# Patient Record
Sex: Female | Born: 1960 | Hispanic: No | Smoking: Current every day smoker
Health system: Southern US, Community
[De-identification: ages and names within clinical notes are randomized; demographics above are authoritative.]

## PROBLEM LIST (undated history)

## (undated) DIAGNOSIS — F32A Depression, unspecified: Secondary | ICD-10-CM

## (undated) DIAGNOSIS — F329 Major depressive disorder, single episode, unspecified: Secondary | ICD-10-CM

---

## 2000-04-08 ENCOUNTER — Encounter (INDEPENDENT_AMBULATORY_CARE_PROVIDER_SITE_OTHER): Payer: Self-pay | Admitting: Specialist

## 2000-04-08 ENCOUNTER — Ambulatory Visit (HOSPITAL_COMMUNITY): Admission: RE | Admit: 2000-04-08 | Discharge: 2000-04-08 | Payer: Self-pay | Admitting: Gynecology

## 2001-09-11 ENCOUNTER — Emergency Department (HOSPITAL_COMMUNITY): Admission: EM | Admit: 2001-09-11 | Discharge: 2001-09-11 | Payer: Self-pay | Admitting: Emergency Medicine

## 2001-09-11 ENCOUNTER — Encounter: Payer: Self-pay | Admitting: Emergency Medicine

## 2002-05-02 ENCOUNTER — Emergency Department (HOSPITAL_COMMUNITY): Admission: EM | Admit: 2002-05-02 | Discharge: 2002-05-03 | Payer: Self-pay | Admitting: Emergency Medicine

## 2006-12-13 ENCOUNTER — Emergency Department (HOSPITAL_COMMUNITY): Admission: EM | Admit: 2006-12-13 | Discharge: 2006-12-13 | Payer: Self-pay | Admitting: Emergency Medicine

## 2007-05-22 ENCOUNTER — Emergency Department (HOSPITAL_COMMUNITY): Admission: EM | Admit: 2007-05-22 | Discharge: 2007-05-22 | Payer: Self-pay | Admitting: Podiatry

## 2008-06-09 ENCOUNTER — Emergency Department (HOSPITAL_COMMUNITY): Admission: EM | Admit: 2008-06-09 | Discharge: 2008-06-10 | Payer: Self-pay | Admitting: Emergency Medicine

## 2009-03-20 ENCOUNTER — Emergency Department (HOSPITAL_COMMUNITY): Admission: EM | Admit: 2009-03-20 | Discharge: 2009-03-20 | Payer: Self-pay | Admitting: Emergency Medicine

## 2010-03-07 ENCOUNTER — Emergency Department (HOSPITAL_COMMUNITY): Admission: EM | Admit: 2010-03-07 | Discharge: 2010-03-07 | Payer: Self-pay | Admitting: Emergency Medicine

## 2011-02-12 NOTE — Op Note (Signed)
[facility]  Patient:    MALICIA, BLASDEL                          MRN: [id number] Proc. Date: 04/08/00 Adm. Date:  [id number] Attending:  Katrina Stack                           Operative Report  PREOPERATIVE DIAGNOSIS:  Nonviable uterine pregnancy [redacted] weeks gestation with large uterine ______.  POSTOPERATIVE DIAGNOSIS:  Nonviable uterine pregnancy [redacted] weeks gestation with large uterine ______.  PROCEDURE:  Suction and sharp curettage.  SURGEON:  Beather Arbour, M.D.  ANESTHESIA:  Xylocaine 1%, 20 cc paracervical block.  DESCRIPTION OF PROCEDURE:  Under excellent anesthesia as above with the patient prepped and draped in lithotomy position, the cervix was grasped with a single tooth tenaculum and pulled down into view. It was then progressively dilated to accommodate a 7 mm suction curette. The endometrial cavity was then evacuated by suction curettage. A large amount of dark brown material was delivered along with visible products of conception. At this point, after no further tissue or material was delivered the cavity was examined by sharp curette and polyp forceps, again no further tissue was produced. At this point, the procedure was terminated without complications. The patient returned to the recovery room in excellent condition. DD:  04/08/00 TD:  04/08/00 Job: [id number]

## 2011-06-30 LAB — URINALYSIS, ROUTINE W REFLEX MICROSCOPIC
Bilirubin Urine: NEGATIVE
Glucose, UA: NEGATIVE
Hgb urine dipstick: NEGATIVE
Ketones, ur: NEGATIVE
Protein, ur: NEGATIVE
Urobilinogen, UA: 1

## 2011-06-30 LAB — DIFFERENTIAL
Basophils Absolute: 0.1
Eosinophils Relative: 2
Lymphocytes Relative: 41
Neutrophils Relative %: 48

## 2011-06-30 LAB — CBC
HCT: 36.1
Platelets: 442 — ABNORMAL HIGH
RDW: 20.3 — ABNORMAL HIGH

## 2011-06-30 LAB — POCT I-STAT, CHEM 8
BUN: 19
Hemoglobin: 13.3
Potassium: 3.7
Sodium: 142
TCO2: 24

## 2011-06-30 LAB — RAPID URINE DRUG SCREEN, HOSP PERFORMED
Amphetamines: NOT DETECTED
Benzodiazepines: NOT DETECTED
Cocaine: POSITIVE — AB
Tetrahydrocannabinol: NOT DETECTED

## 2011-06-30 LAB — ETHANOL: Alcohol, Ethyl (B): <5

## 2011-06-30 LAB — POCT PREGNANCY, URINE: Preg Test, Ur: NEGATIVE

## 2011-07-09 LAB — I-STAT 8, (EC8 V) (CONVERTED LAB)
BUN: 16
Glucose, Bld: 99
Hemoglobin: 11.2 — ABNORMAL LOW
Potassium: 3.7
Sodium: 141

## 2011-07-09 LAB — ETHANOL: Alcohol, Ethyl (B): <5

## 2011-07-09 LAB — RAPID URINE DRUG SCREEN, HOSP PERFORMED
Barbiturates: NOT DETECTED
Benzodiazepines: NOT DETECTED

## 2014-04-27 ENCOUNTER — Encounter (HOSPITAL_COMMUNITY): Payer: Self-pay | Admitting: Emergency Medicine

## 2014-04-27 ENCOUNTER — Emergency Department (HOSPITAL_COMMUNITY)
Admission: EM | Admit: 2014-04-27 | Discharge: 2014-04-27 | Disposition: A | Discharge status: Home / Self Care | Payer: Self-pay | Attending: Emergency Medicine | Admitting: Emergency Medicine

## 2014-04-27 DIAGNOSIS — F17200 Nicotine dependence, unspecified, uncomplicated: Secondary | ICD-10-CM | POA: Insufficient documentation

## 2014-04-27 DIAGNOSIS — R109 Unspecified abdominal pain: Secondary | ICD-10-CM | POA: Insufficient documentation

## 2014-04-27 DIAGNOSIS — F3289 Other specified depressive episodes: Secondary | ICD-10-CM | POA: Insufficient documentation

## 2014-04-27 DIAGNOSIS — Z792 Long term (current) use of antibiotics: Secondary | ICD-10-CM | POA: Insufficient documentation

## 2014-04-27 DIAGNOSIS — N390 Urinary tract infection, site not specified: Secondary | ICD-10-CM | POA: Insufficient documentation

## 2014-04-27 DIAGNOSIS — F329 Major depressive disorder, single episode, unspecified: Secondary | ICD-10-CM | POA: Insufficient documentation

## 2014-04-27 DIAGNOSIS — Z79899 Other long term (current) drug therapy: Secondary | ICD-10-CM | POA: Insufficient documentation

## 2014-04-27 DIAGNOSIS — Z791 Long term (current) use of non-steroidal anti-inflammatories (NSAID): Secondary | ICD-10-CM | POA: Insufficient documentation

## 2014-04-27 HISTORY — DX: Major depressive disorder, single episode, unspecified: F32.9

## 2014-04-27 HISTORY — DX: Depression, unspecified: F32.A

## 2014-04-27 LAB — URINALYSIS, ROUTINE W REFLEX MICROSCOPIC
Glucose, UA: NEGATIVE mg/dL
Hgb urine dipstick: NEGATIVE
Ketones, ur: 15 mg/dL — AB
Nitrite: POSITIVE — AB
Protein, ur: 30 mg/dL — AB
Specific Gravity, Urine: 1.04 — ABNORMAL HIGH (ref 1.005–1.030)
Urobilinogen, UA: 2 mg/dL — ABNORMAL HIGH (ref 0.0–1.0)
pH: 5 (ref 5.0–8.0)

## 2014-04-27 LAB — URINE MICROSCOPIC-ADD ON

## 2014-04-27 MED ORDER — ONDANSETRON 4 MG PO TBDP
4.0000 mg | ORAL_TABLET | Freq: Once | ORAL | Status: AC
  Administered 2014-04-27: 4 mg via ORAL
  Filled 2014-04-27: qty 1

## 2014-04-27 MED ORDER — IBUPROFEN 200 MG PO TABS
600.0000 mg | ORAL_TABLET | Freq: Once | ORAL | Status: AC
  Administered 2014-04-27: 600 mg via ORAL
  Filled 2014-04-27: qty 3

## 2014-04-27 MED ORDER — HYDROMORPHONE HCL PF 1 MG/ML IJ SOLN
1.0000 mg | Freq: Once | INTRAMUSCULAR | Status: AC
  Administered 2014-04-27: 1 mg via INTRAMUSCULAR
  Filled 2014-04-27: qty 1

## 2014-04-27 MED ORDER — CEPHALEXIN 500 MG PO CAPS
500.0000 mg | ORAL_CAPSULE | Freq: Once | ORAL | Status: AC
  Administered 2014-04-27: 500 mg via ORAL
  Filled 2014-04-27: qty 1

## 2014-04-27 MED ORDER — ONDANSETRON HCL 4 MG PO TABS: 4.0000 mg | ORAL_TABLET | Freq: Four times a day (QID) | ORAL | Status: DC

## 2014-04-27 MED ORDER — OXYCODONE-ACETAMINOPHEN 5-325 MG PO TABS: 1.0000 | ORAL_TABLET | ORAL | Status: DC | PRN

## 2014-04-27 MED ORDER — CEPHALEXIN 500 MG PO CAPS: 500.0000 mg | ORAL_CAPSULE | Freq: Four times a day (QID) | ORAL | Status: DC

## 2014-04-27 NOTE — ED Notes (Signed)
Pt states she took AZO pills and her urine may have an altered color

## 2014-04-27 NOTE — ED Notes (Signed)
Pt complains of abd pain since 8pm, she has vomited a few times and states that the pain radiates to her back, she complains of urgency and burning urination

## 2014-04-27 NOTE — Discharge Instructions (Signed)
Urinary Tract Infection °Urinary tract infections (UTIs) can develop anywhere along your urinary tract. Your urinary tract is your body's drainage system for removing wastes and extra water. Your urinary tract includes two kidneys, two ureters, a bladder, and a urethra. Your kidneys are a pair of bean-shaped organs. Each kidney is about the size of your fist. They are located below your ribs, one on each side of your spine. °CAUSES °Infections are caused by microbes, which are microscopic organisms, including fungi, viruses, and bacteria. These organisms are so small that they can only be seen through a microscope. Bacteria are the microbes that most commonly cause UTIs. °SYMPTOMS  °Symptoms of UTIs may vary by age and gender of the patient and by the location of the infection. Symptoms in young women typically include a frequent and intense urge to urinate and a painful, burning feeling in the bladder or urethra during urination. Older women and men are more likely to be tired, shaky, and weak and have muscle aches and abdominal pain. A fever may mean the infection is in your kidneys. Other symptoms of a kidney infection include pain in your back or sides below the ribs, nausea, and vomiting. °DIAGNOSIS °To diagnose a UTI, your caregiver will ask you about your symptoms. Your caregiver also will ask to provide a urine sample. The urine sample will be tested for bacteria and white blood cells. White blood cells are made by your body to help fight infection. °TREATMENT  °Typically, UTIs can be treated with medication. Because most UTIs are caused by a bacterial infection, they usually can be treated with the use of antibiotics. The choice of antibiotic and length of treatment depend on your symptoms and the type of bacteria causing your infection. °HOME CARE INSTRUCTIONS °· If you were prescribed antibiotics, take them exactly as your caregiver instructs you. Finish the medication even if you feel better after you  have only taken some of the medication. °· Drink enough water and fluids to keep your urine clear or pale yellow. °· Avoid caffeine, tea, and carbonated beverages. They tend to irritate your bladder. °· Empty your bladder often. Avoid holding urine for long periods of time. °· Empty your bladder before and after sexual intercourse. °· After a bowel movement, women should cleanse from front to back. Use each tissue only once. °SEEK MEDICAL CARE IF:  °· You have back pain. °· You develop a fever. °· Your symptoms do not begin to resolve within 3 days. °SEEK IMMEDIATE MEDICAL CARE IF:  °· You have severe back pain or lower abdominal pain. °· You develop chills. °· You have nausea or vomiting. °· You have continued burning or discomfort with urination. °MAKE SURE YOU:  °· Understand these instructions. °· Will watch your condition. °· Will get help right away if you are not doing well or get worse. °Document Released: 06/23/2005 Document Revised: 03/14/2012 Document Reviewed: 10/22/2011 °ExitCare® Patient Information ©2015 [employer]. This information is not intended to replace advice given to you by your health care provider. Make sure you discuss any questions you have with your health care provider. ° ° °Emergency Department Resource Guide °1) Find a Doctor and Pay Out of Pocket °Although you won't have to find out who is covered by your insurance plan, it is a good idea to ask around and get recommendations. You will then need to call the office and see if the doctor you have chosen will accept you as a new patient and what types of   options they offer for patients who are self-pay. Some doctors offer discounts or will set up payment plans for their patients who do not have insurance, but you will need to ask so you aren't surprised when you get to your appointment. ° °2) Contact Your Local Health Department °Not all health departments have doctors that can see patients for sick visits, but many do, so it is worth  a call to see if yours does. If you don't know where your local health department is, you can check in your phone book. The CDC also has a tool to help you locate your state's health department, and many state websites also have listings of all of their local health departments. ° °3) Find a Walk-in Clinic °If your illness is not likely to be very severe or complicated, you may want to try a walk in clinic. These are popping up all over the country in pharmacies, drugstores, and shopping centers. They're usually staffed by nurse practitioners or physician assistants that have been trained to treat common illnesses and complaints. They're usually fairly quick and inexpensive. However, if you have serious medical issues or chronic medical problems, these are probably not your best option. ° °No Primary Care Doctor: °- Call [facility] at  832-8000 - they can help you locate a primary care doctor that  accepts your insurance, provides certain services, etc. °- Physician Referral Service- 1-800-533-3463 ° °Chronic Pain Problems: °Organization         Address  Phone   Notes  °[facility]  (336) 297-2271 Patients need to be referred by their primary care doctor.  ° °Medication Assistance: °Organization         Address  Phone   Notes  °[place] [facility] [address] °[place], [place] [address] (336) 641-8030 --Must be a resident of [place] °-- Must have NO insurance coverage whatsoever (no Medicaid/ Medicare, etc.) °-- The pt. MUST have a primary care doctor that directs their care regularly and follows them in the community °  °[facility]  (866) 331-1348   °[employer]  (888) 892-1162   ° °Agencies that provide inexpensive medical care: °Organization         Address  Phone   Notes  °[facility]  (336) 832-8035   °[facility]    (336) 832-7272   °[facility] [address] °[place], [place]  [address] (336) 832-4777   °[facility] [address], °[place] (336) 271-4999   °[facility]    (336) 373-0678   °[facility]    (336) 272-1050   °[facility] ° [address], [place] Phone:  (336) 832-4444, Fax:  (336) 832-4440 Hours of Operation:  9 am - 6 pm, M-F.  Also accepts Medicaid/Medicare and self-pay.  °[facility] ° [address], [place] Phone: (336) 832-3150, Fax: (336) 832-3151. Hours of Operation:  8:30 am - 5:30 pm, M-F.  Also accepts Medicaid and self-pay.  °[facility] [place] [address], [place] Phone: (336) 878-6027   °[facility] [address], [place], [place] (336)723-1848, Ext. 123 Mondays & Thursdays: 7-9 AM.  First 15 patients are seen on a first come, first serve basis. °  ° °Medicaid-accepting [place] Providers: ° °Organization         Address  Phone   Notes  °  [facility] [address], [place] (336) 641-2100 Also accepts self-pay patients.  °[facility] [address], [place] ° (336) 856-9996   °[facility] [address], [place] (336) 288-8857   °[facility] [address] [place] [address], [place] (336) 299-7000   °Veita Bland [address], [place]  ° (336) 373-1557 Only accepts [place] Medicaid patients after they have their name applied to their card.  ° °Self-Pay (no insurance) in [place]: ° °Organization         Address  Phone   Notes  °Sickle Cell Patients, [facility] [address], [place] (336) 832-1970   °[facility] [address], [place] (336) 832-4400   °[facility] [place] ° [address], [place] (336) 992-4800   °[facility]/Dr. Osei-Bonsu ° [address] [place] [address], [place] or [address], [place] (336) 841-8500 Phone number for both [place] and [place] locations is the same.  °[facility] [address], [place] (336) 299-0000   °[facility] [address] [place] [address], [place] or [address] (336) 852-7530 °(336) 878-2260   °[facility] [address], [place] (336) 350-1642, phone; (336) 294-5005, fax Sees patients 1st and 3rd Saturday of every month.  Must not qualify for public or private insurance (i.e. Medicaid, Medicare, [place] Health Choice, Veterans' Benefits) • Household income should be no more than 200% of the poverty level •The clinic cannot treat you if you are pregnant or think you are pregnant • Sexually transmitted diseases are not treated at the clinic.  ° ° °Dental Care: °Organization         Address  Phone  Notes  °[facility] [facility] [address], [place] (336) 641-6152 Accepts children up to age 21 who are enrolled in Medicaid or [place] Health Choice; pregnant women with a Medicaid card; and children who have applied for Medicaid or [place] Health Choice, but were declined, whose parents can pay a reduced fee at time of service.  °[facility] [place]  [address], [place] (336) 641-7733 Accepts children up to age 21 who are enrolled in Medicaid or [place] Health Choice; pregnant women with a Medicaid card; and children who have applied for Medicaid or [place] Health Choice, but were declined, whose parents can pay a reduced fee at time of service.  °[facility] ° [address], [place] (336) 641-4533 Patients are seen by appointment only. Walk-ins are not accepted. [facility] will see patients 18 years of age and older. °Monday - Tuesday (8am-5pm) °Most Wednesdays (8:30-5pm) °$30 per visit, cash only  °[facility] ° [address], [place] (336) 641-4533  Patients are seen by appointment only. Walk-ins are not accepted. [facility] will see patients 18 years of age and older. °One Wednesday Evening (Monthly: Volunteer Based).  $30 per visit, cash only  °[facility]  (919) 537-3737 for adults; Children under age 4, call Graduate Pediatric Dentistry at (919) [phone]. Children aged 4-14, please call (919) [phone] to request a pediatric application. ° Dental services are provided in all areas of dental care including fillings, crowns and bridges, complete and partial   dentures, implants, gum treatment, root canals, and extractions. Preventive care is also provided. Treatment is provided to both adults and children. °Patients are selected via a lottery and there is often a waiting list. °  °[facility] [address], °[place] ° (336) 763-8833 [email or web address] °  °[facility] [address], [place], [place] (336)723-1848, Ext. 123 Second and Fourth Thursday of each month, opens at 6:30 AM; Clinic ends at 9 AM.  Patients are seen on a first-come first-served basis, and a limited number are seen during each clinic.  ° °[facility] ° [address], [place], [place] (336) 723-7904   Eligibility Requirements °You must have lived in [place], [place], or [place] counties for at least the last three months. °  You cannot be eligible for state or federal sponsored healthcare insurance, including Veterans Administration, Medicaid, or Medicare. °  You generally cannot be eligible for healthcare insurance through your employer.  °  How to apply: °Eligibility screenings are held every Tuesday and Wednesday afternoon from 1:00 pm until 4:00 pm. You do not need an appointment for the interview!  °[facility] [address], [place], [place] 336-631-2330   °[facility]  336-342-8273   °[facility]  336-703-3100   ° [facility]   336-570-6415   ° °Behavioral Health Resources in the Community: °Intensive Outpatient Programs °Organization         Address  Phone  Notes  °[facility] [address], [place], [place] 336-878-6098   °[facility] [address], [place], [place] 336-832-9800   °[facility] [address], [place], [place] ° 336-882-2125   °[facility] [address],  °[place], [place] 1-800-853-5163 or 336-641-4981   °Substance Abuse Resources °Organization         Address  Phone  Notes  °[facility]  336-882-2125   °[facility]  336-784-9470   °[facility]  336-285-9073   °[facility]  336-845-3988   °Residential & Outpatient Substance Abuse Program  1-800-659-3381   °Psychological Services °Organization         Address  Phone  Notes  °[facility]  336- 832-9600   °[facility]  336- 378-7881   °[facility] [address], [place] 1-800-853-5163 or 336-641-4981   ° °Mobile Crisis Teams °Organization         Address  Phone  Notes  °[facility], Mobile Crisis Care Unit  1-877-626-1772   °Assertive °[facility] ° [address] [place], [place] 336-834-9664   °Sharon DeEsch [address] °[place] 336-554-5454   ° °Self-Help/Support Groups °Organization         Address  Phone             Notes  °[facility] [place] - variety of support groups  336- 373-1402 Call for more information  °Narcotics Anonymous (NA), [facility] [address], °[place]  2 meetings at this location  ° °Residential Treatment Programs °Organization         Address  Phone  Notes  °[facility] [address],    °[place]   1-866-801-8205   °[facility] ° [address], [place],  704-293-8524   °[facility] [address], [place] 336-845-3988 Admissions: 8am-3pm M-F   °  [facility] [address]    °[place], [place] 336-841-1104   °[facility] [address], [place], [place] 336-379-7146   °[facility] [address]  °[place], [place] 336-285-9073   °[facility] Intensive Outpatient [address], [place], [place] 336-852-3033   °[facility] [address]  °[place], [place] 1-877-615-2722 or 336-784-9470   °[facility] [address] [place], [place] 336-227-7417 Accepts Medicaid  °[facility] [address]  °[place] 1-800-659-3381 Substance Abuse/Addiction Treatment  ° °[place] Behavioral Health Resources °Organization         Address  Phone  Notes  °[facility]  (888) 581-9988   °Julie Brannon, PhD [address] [place], [place]   (336) 349-5553 or (336) 951-0000   °[facility]   [address] °[place], [place] (336) 349-4454   °[facility] [address], [place], [place] (336) 342-8316 Insurance/Medicaid/sponsorship through [facility]  °[facility] [address]                                    [place], [place] (336) 342-8316 Therapy/tele-psych/case  °[facility] [address]  ° [place], [place] (336) 349-2233    °Dr. Arfeen  (336) 349-4544   °[facility] [place]  [employer] [place] [facility] 1) [address], [place] °2) [address], [place] °3)  [address], [place] (336) 349-3220 °(336) 342-7768 ° °(336) 342-8140   °[place] [facility] (336) 342-1394 or (336) 342-3537 (After Hours)    ° ° ° ° °

## 2014-04-27 NOTE — ED Notes (Signed)
Bed: WA13 Expected date:  Expected time:  Means of arrival:  Comments: 

## 2014-04-27 NOTE — ED Notes (Signed)
Pt given medication and will rest here for about 45 minutes

## 2014-04-28 LAB — URINE CULTURE
Colony Count: NO GROWTH
Culture: NO GROWTH

## 2014-05-05 NOTE — ED Provider Notes (Signed)
CSN: [id number]     Arrival date & time 04/27/14  0329 History   First MD Initiated Contact with Patient 04/27/14 [phone]     Chief Complaint  Patient presents with  . Abdominal Pain     (Consider location/radiation/quality/duration/timing/severity/associated sxs/prior Treatment) HPI  52yF with abdominal pain. Onset last evening around 2000. Pain in lower abdomen. Feels like bad cramps. Radiates into lower back. Associated with n/v. Dysuria and feeling of urgency. No hematuria. No unusual vaginal bleeding or discharge. No fever or chills. No diarrhea. No sick contacts.   Past Medical History  Diagnosis Date  . Depressed    History reviewed. No pertinent past surgical history. History reviewed. No pertinent family history. History  Substance Use Topics  . Smoking status: Current Every Day Smoker  . Smokeless tobacco: Not on file  . Alcohol Use: Yes   OB History   Grav Para Term Preterm Abortions TAB SAB Ect Mult Living                 Review of Systems  All systems reviewed and negative, other than as noted in HPI.   Allergies  Review of patient's allergies indicates no known allergies.  Home Medications   Prior to Admission medications   Medication Sig Start Date End Date Taking? Authorizing Provider  gabapentin (NEURONTIN) 300 MG capsule Take 300 mg by mouth 3 (three) times daily.   Yes Historical Provider, MD  traZODone (DESYREL) 100 MG tablet Take 150 mg by mouth at bedtime.    Yes Historical Provider, MD  cephALEXin (KEFLEX) 500 MG capsule Take 1 capsule (500 mg total) by mouth 4 (four) times daily. 04/27/14   Raeford RazorStephen Nayra Coury, MD  ondansetron (ZOFRAN) 4 MG tablet Take 1 tablet (4 mg total) by mouth every 6 (six) hours. 04/27/14   Raeford RazorStephen Yehoshua Vitelli, MD  oxyCODONE-acetaminophen (PERCOCET/ROXICET) 5-325 MG per tablet Take 1 tablet by mouth every 4 (four) hours as needed for severe pain. 04/27/14   Raeford RazorStephen Wendolyn Raso, MD   BP 108/71  Pulse 52  Temp(Src) 97.9 F (36.6 C) (Oral)  Resp  17  Ht 5\' 2"  (1.575 m)  Wt 130 lb (58.968 kg)  BMI 23.77 kg/m2  SpO2 100% Physical Exam  Nursing note and vitals reviewed. Constitutional: She appears well-developed and well-nourished. No distress.  HENT:  Head: Normocephalic and atraumatic.  Eyes: Conjunctivae are normal. Right eye exhibits no discharge. Left eye exhibits no discharge.  Neck: Neck supple.  Cardiovascular: Normal rate, regular rhythm and normal heart sounds.  Exam reveals no gallop and no friction rub.   No murmur heard. Pulmonary/Chest: Effort normal and breath sounds normal. No respiratory distress.  Abdominal: Soft. She exhibits no distension. There is tenderness.  Mild suprapubic tenderness. No rebound/guarding.   Genitourinary:  No cva tenderness  Musculoskeletal: She exhibits no edema and no tenderness.  Neurological: She is alert.  Skin: Skin is warm and dry.  Psychiatric: She has a normal mood and affect. Her behavior is normal. Thought content normal.    ED Course  Procedures (including critical care time) Labs Review Labs Reviewed  URINALYSIS, ROUTINE W REFLEX MICROSCOPIC - Abnormal; Notable for the following:    Color, Urine RED (*)    APPearance CLOUDY (*)    Specific Gravity, Urine 1.040 (*)    Bilirubin Urine SMALL (*)    Ketones, ur 15 (*)    Protein, ur 30 (*)    Urobilinogen, UA 2.0 (*)    Nitrite POSITIVE (*)    Leukocytes,  UA SMALL (*)    All other components within normal limits  URINE MICROSCOPIC-ADD ON - Abnormal; Notable for the following:    Squamous Epithelial / LPF FEW (*)    Bacteria, UA FEW (*)    All other components within normal limits  URINE CULTURE    Imaging Review No results found.   EKG Interpretation None      MDM   Final diagnoses:  UTI (lower urinary tract infection)    52yF with dysuria. UA concerning for UTI. I feel appropriate for outpt tx. Urine culture sent.     Raeford Razor, MD 05/05/14 [phone]

## 2015-01-05 ENCOUNTER — Emergency Department (HOSPITAL_COMMUNITY)
Admission: EM | Admit: 2015-01-05 | Discharge: 2015-01-06 | Disposition: A | Discharge status: Home / Self Care | Payer: No Typology Code available for payment source | Attending: Emergency Medicine | Admitting: Emergency Medicine

## 2015-01-05 ENCOUNTER — Encounter (HOSPITAL_COMMUNITY): Payer: Self-pay | Admitting: Emergency Medicine

## 2015-01-05 ENCOUNTER — Emergency Department (HOSPITAL_COMMUNITY): Payer: No Typology Code available for payment source

## 2015-01-05 DIAGNOSIS — Z79899 Other long term (current) drug therapy: Secondary | ICD-10-CM | POA: Diagnosis not present

## 2015-01-05 DIAGNOSIS — R9431 Abnormal electrocardiogram [ECG] [EKG]: Secondary | ICD-10-CM | POA: Insufficient documentation

## 2015-01-05 DIAGNOSIS — R0602 Shortness of breath: Secondary | ICD-10-CM | POA: Diagnosis present

## 2015-01-05 DIAGNOSIS — F329 Major depressive disorder, single episode, unspecified: Secondary | ICD-10-CM | POA: Diagnosis not present

## 2015-01-05 DIAGNOSIS — Z720 Tobacco use: Secondary | ICD-10-CM | POA: Insufficient documentation

## 2015-01-05 LAB — URINALYSIS, ROUTINE W REFLEX MICROSCOPIC
BILIRUBIN URINE: NEGATIVE
GLUCOSE, UA: NEGATIVE mg/dL
HGB URINE DIPSTICK: NEGATIVE
Ketones, ur: NEGATIVE mg/dL
Leukocytes, UA: NEGATIVE
Nitrite: NEGATIVE
PROTEIN: NEGATIVE mg/dL
Specific Gravity, Urine: 1.017 (ref 1.005–1.030)
Urobilinogen, UA: 0.2 mg/dL (ref 0.0–1.0)
pH: 8 (ref 5.0–8.0)

## 2015-01-05 LAB — I-STAT TROPONIN, ED
Troponin i, poc: 0 ng/mL (ref 0.00–0.08)
Troponin i, poc: 0 ng/mL (ref 0.00–0.08)

## 2015-01-05 LAB — CBC WITH DIFFERENTIAL/PLATELET
BASOS ABS: 0 10*3/uL (ref 0.0–0.1)
Basophils Relative: 0 % (ref 0–1)
EOS PCT: 1 % (ref 0–5)
Eosinophils Absolute: 0.1 10*3/uL (ref 0.0–0.7)
HEMATOCRIT: 41.2 % (ref 36.0–46.0)
Hemoglobin: 14.2 g/dL (ref 12.0–15.0)
Lymphocytes Relative: 46 % (ref 12–46)
Lymphs Abs: 2.7 10*3/uL (ref 0.7–4.0)
MCH: 32.4 pg (ref 26.0–34.0)
MCHC: 34.5 g/dL (ref 30.0–36.0)
MCV: 94.1 fL (ref 78.0–100.0)
MONOS PCT: 7 % (ref 3–12)
Monocytes Absolute: 0.4 10*3/uL (ref 0.1–1.0)
NEUTROS ABS: 2.8 10*3/uL (ref 1.7–7.7)
NEUTROS PCT: 46 % (ref 43–77)
PLATELETS: 276 10*3/uL (ref 150–400)
RBC: 4.38 MIL/uL (ref 3.87–5.11)
RDW: 12.3 % (ref 11.5–15.5)
WBC: 5.9 10*3/uL (ref 4.0–10.5)

## 2015-01-05 LAB — I-STAT CHEM 8, ED
BUN: 12 mg/dL (ref 6–23)
CALCIUM ION: 1.24 mmol/L — AB (ref 1.12–1.23)
CHLORIDE: 103 mmol/L (ref 96–112)
Creatinine, Ser: 1 mg/dL (ref 0.50–1.10)
Glucose, Bld: 101 mg/dL — ABNORMAL HIGH (ref 70–99)
HCT: 49 % — ABNORMAL HIGH (ref 36.0–46.0)
Hemoglobin: 16.7 g/dL — ABNORMAL HIGH (ref 12.0–15.0)
POTASSIUM: 3.7 mmol/L (ref 3.5–5.1)
Sodium: 141 mmol/L (ref 135–145)
TCO2: 22 mmol/L (ref 0–100)

## 2015-01-05 LAB — COMPREHENSIVE METABOLIC PANEL
ALK PHOS: 50 U/L (ref 39–117)
ALT: 17 U/L (ref 0–35)
AST: 20 U/L (ref 0–37)
Albumin: 4.1 g/dL (ref 3.5–5.2)
Anion gap: 8 (ref 5–15)
BUN: 13 mg/dL (ref 6–23)
CO2: 26 mmol/L (ref 19–32)
CREATININE: 0.98 mg/dL (ref 0.50–1.10)
Calcium: 9.6 mg/dL (ref 8.4–10.5)
Chloride: 105 mmol/L (ref 96–112)
GFR calc Af Amer: 76 mL/min — ABNORMAL LOW (ref >90)
GFR calc non Af Amer: 65 mL/min — ABNORMAL LOW (ref >90)
GLUCOSE: 107 mg/dL — AB (ref 70–99)
Potassium: 3.7 mmol/L (ref 3.5–5.1)
Sodium: 139 mmol/L (ref 135–145)
Total Bilirubin: 0.6 mg/dL (ref 0.3–1.2)
Total Protein: 6.9 g/dL (ref 6.0–8.3)

## 2015-01-05 LAB — D-DIMER, QUANTITATIVE: D-Dimer, Quant: <0.27 ug/mL-FEU (ref 0.00–0.48)

## 2015-01-05 LAB — BRAIN NATRIURETIC PEPTIDE: B NATRIURETIC PEPTIDE 5: 23 pg/mL (ref 0.0–100.0)

## 2015-01-05 LAB — LIPASE, BLOOD: Lipase: 27 U/L (ref 11–59)

## 2015-01-05 MED ORDER — LORAZEPAM 2 MG/ML IJ SOLN: 1.0000 mg | Freq: Once | INTRAMUSCULAR | Status: DC

## 2015-01-05 MED ORDER — ASPIRIN 81 MG PO CHEW
324.0000 mg | CHEWABLE_TABLET | Freq: Once | ORAL | Status: AC
  Administered 2015-01-05: 324 mg via ORAL
  Filled 2015-01-05: qty 4

## 2015-01-05 MED ORDER — LORAZEPAM 1 MG PO TABS
1.0000 mg | ORAL_TABLET | Freq: Once | ORAL | Status: AC
  Administered 2015-01-05: 1 mg via ORAL
  Filled 2015-01-05: qty 1

## 2015-01-05 NOTE — Discharge Instructions (Signed)
It is very important that you follow with the cardiologist as soon as possible. Do not hesitate to return to emergency room for worsening or concerning symptoms.

## 2015-01-05 NOTE — ED Notes (Signed)
Pt denies chest pain but reports just short of breath. Pt resting quietly. Appears in no acute distress.

## 2015-01-05 NOTE — ED Provider Notes (Signed)
CSN: [id number]     Arrival date & time 01/05/15  1925 History   First MD Initiated Contact with Patient 01/05/15 2007     Chief Complaint  Patient presents with  . Shortness of Breath     (Consider location/radiation/quality/duration/timing/severity/associated sxs/prior Treatment) HPI   Sara Reeves is a 54 y.o. female complaining of acute onset of shortness of breath which woke her from sleep at 6 AM this morning. States that she has about a 3 pillow orthopnea with this which is new. States that she has been trying to sleep all day but she is woken up from sleep every time with the sensation of shortness of breath. She denies chest pain/Tightness, increasing peripheral edema, history of cardiac issues, family history of cardiac issues, history of DVT or PE, recent immobilization, calf pain or leg swelling, exogenous estrogen, fever, chills, cough. States that she feels nauseous and panicky on review of systems. Patient lost her job 2 weeks ago.   Smokes 3 cigarettes daily, has smoked at this level all of her life.   Past Medical History  Diagnosis Date  . Depressed    History reviewed. No pertinent past surgical history. No family history on file. History  Substance Use Topics  . Smoking status: Current Every Day Smoker  . Smokeless tobacco: Not on file  . Alcohol Use: Yes   OB History    No data available     Review of Systems  10 systems reviewed and found to be negative, except as noted in the HPI.   Allergies  Review of patient's allergies indicates no known allergies.  Home Medications   Prior to Admission medications   Medication Sig Start Date End Date Taking? Authorizing Provider  gabapentin (NEURONTIN) 300 MG capsule Take 300 mg by mouth 3 (three) times daily.   Yes Historical Provider, MD  ranitidine (ZANTAC) 150 MG tablet Take 150 mg by mouth daily as needed for heartburn (indigestion).   Yes Historical Provider, MD  traZODone (DESYREL) 100 MG tablet Take 150  mg by mouth at bedtime.    Yes Historical Provider, MD  cephALEXin (KEFLEX) 500 MG capsule Take 1 capsule (500 mg total) by mouth 4 (four) times daily. Patient not taking: Reported on 01/05/2015 04/27/14   Raeford Razor, MD  LORazepam (ATIVAN) 1 MG tablet Take 1 tablet (1 mg total) by mouth every 6 (six) hours as needed for anxiety or sleep. 01/06/15   Cambree Hendrix, PA-C  ondansetron (ZOFRAN) 4 MG tablet Take 1 tablet (4 mg total) by mouth every 6 (six) hours. Patient not taking: Reported on 01/05/2015 04/27/14   Raeford Razor, MD  oxyCODONE-acetaminophen (PERCOCET/ROXICET) 5-325 MG per tablet Take 1 tablet by mouth every 4 (four) hours as needed for severe pain. Patient not taking: Reported on 01/05/2015 04/27/14   Raeford Razor, MD   BP 117/89 mmHg  Pulse 51  Temp(Src) 98.1 F (36.7 C) (Oral)  Resp 18  Ht  (1.575 m)  Wt 130 lb (58.968 kg)  BMI 23.77 kg/m2  SpO2 97% Physical Exam  Constitutional: She is oriented to person, place, and time. She appears well-developed and well-nourished. No distress.  HENT:  Head: Normocephalic and atraumatic.  Mouth/Throat: Oropharynx is clear and moist.  Eyes: Conjunctivae and EOM are normal. Pupils are equal, round, and reactive to light.  Neck: Normal range of motion. No JVD present.  Cardiovascular: Normal rate, regular rhythm and intact distal pulses.   Pulmonary/Chest: Effort normal and breath sounds normal. No stridor. No  respiratory distress. She has no wheezes. She has no rales. She exhibits no tenderness.  Abdominal: Soft. Bowel sounds are normal.  Musculoskeletal: Normal range of motion. She exhibits no edema or tenderness.  No calf asymmetry, superficial collaterals, palpable cords, edema, Homans sign negative bilaterally.   Neurological: She is alert and oriented to person, place, and time.  Psychiatric: She has a normal mood and affect.  Nursing note and vitals reviewed.   ED Course  Procedures (including critical care time) Labs  Review Labs Reviewed  COMPREHENSIVE METABOLIC PANEL - Abnormal; Notable for the following:    Glucose, Bld 107 (*)    GFR calc non Af Amer 65 (*)    GFR calc Af Amer 76 (*)    All other components within normal limits  I-STAT CHEM 8, ED - Abnormal; Notable for the following:    Glucose, Bld 101 (*)    Calcium, Ion 1.24 (*)    Hemoglobin 16.7 (*)    HCT 49.0 (*)    All other components within normal limits  CBC WITH DIFFERENTIAL/PLATELET  LIPASE, BLOOD  URINALYSIS, ROUTINE W REFLEX MICROSCOPIC  BRAIN NATRIURETIC PEPTIDE  D-DIMER, QUANTITATIVE  I-STAT TROPOININ, ED  Sara SensorI-STAT TROPOININ, ED    Imaging Review Dg Chest 2 View  01/05/2015   CLINICAL DATA:  Shortness of breath, chest tightness.  EXAM: CHEST  2 VIEW  COMPARISON:  None.  FINDINGS: Central bronchial thickening. The cardiomediastinal contours are normal. Pulmonary vasculature is normal. No consolidation, pleural effusion, or pneumothorax. No acute osseous abnormalities are seen.  IMPRESSION: Central bronchial thickening, may reflect bronchitis or asthma.   Electronically Signed   By: Rubye OaksMelanie  Ehinger M.D.   On: 01/05/2015 21:26     EKG Interpretation None      MDM   Final diagnoses:  SOB (shortness of breath)  Abnormal EKG    Filed Vitals:   01/05/15 2200 01/05/15 2230 01/05/15 2300 01/05/15 2356  BP: 116/75 125/71 129/91 117/89  Pulse: 53 50 63 51  Temp:    98.1 F (36.7 C)  TempSrc:    Oral  Resp: 21   18  Height:      Weight:      SpO2: 96% 100% 100% 97%    Medications  aspirin chewable tablet 324 mg (324 mg Oral Given 01/05/15 2030)  LORazepam (ATIVAN) tablet 1 mg (1 mg Oral Given 01/05/15 2320)    Sara Reeves is a pleasant 54 y.o. female presenting with acute onset of shortness of breath orthopnea, panicky feeling and nausea this morning. Physical exam is not consistent with CHF, lung sounds are clear to auscultation. She has no cardiac history. Low risk by heart score. EKG shows new T wave inversions in  the anterior lateral leads. (Comparison EKG is from 2003) blood work including troponin and d-dimer is unremarkable, chest x-rays negative.  Cardiogy consult from Dr. Donnie Ahoilley appreciated: Given the clinical situation and the atypical nature of her shortness of breath, the lack of chest pain and her negative troponin and d-dimer he feels that her emergency workup is complete however, given the abnormal EKG he recommends expedited outpatient evaluation that will involve echo. I have discussed this with the patient and she understands that it is critically important to follow with cardiology. Patient verbalizes her understanding.   Discussed case with attending MD who agrees with plan and stability to d/c to home.   Evaluation does not show pathology that would require ongoing emergent intervention or inpatient treatment. Pt is hemodynamically stable  and mentating appropriately. Discussed findings and plan with patient/guardian, who agrees with care plan. All questions answered. Return precautions discussed and outpatient follow up given.   Discharge Medication List as of 01/06/2015 12:01 AM    START taking these medications   Details  LORazepam (ATIVAN) 1 MG tablet Take 1 tablet (1 mg total) by mouth every 6 (six) hours as needed for anxiety or sleep., Starting 01/06/2015, Until Discontinued, [employer], PA-C 01/06/15 0012  Arby Barrette, MD 01/10/15 [phone]

## 2015-01-05 NOTE — ED Notes (Signed)
Pt states she was very restless this am, awoke this am at [facility]6am SH[facility], generalized chest tightness. +nausea, +orthopnea. Pt repeatedly stating she is very uncomfortable.

## 2015-01-06 MED ORDER — LORAZEPAM 1 MG PO TABS: 1.0000 mg | ORAL_TABLET | Freq: Four times a day (QID) | ORAL | Status: DC | PRN

## 2015-01-16 ENCOUNTER — Ambulatory Visit (INDEPENDENT_AMBULATORY_CARE_PROVIDER_SITE_OTHER): Payer: No Typology Code available for payment source | Admitting: Cardiology

## 2015-01-16 ENCOUNTER — Encounter: Payer: Self-pay | Admitting: Cardiology

## 2015-01-16 DIAGNOSIS — R0601 Orthopnea: Secondary | ICD-10-CM | POA: Diagnosis not present

## 2015-01-16 DIAGNOSIS — R9431 Abnormal electrocardiogram [ECG] [EKG]: Secondary | ICD-10-CM | POA: Diagnosis not present

## 2015-01-16 DIAGNOSIS — R0600 Dyspnea, unspecified: Secondary | ICD-10-CM

## 2015-01-16 DIAGNOSIS — R0609 Other forms of dyspnea: Secondary | ICD-10-CM | POA: Diagnosis not present

## 2015-01-16 DIAGNOSIS — E7800 Pure hypercholesterolemia, unspecified: Secondary | ICD-10-CM

## 2015-01-16 NOTE — Progress Notes (Signed)
Cardiology Office Note   Date:  01/16/2015   ID:  Sara Reeves, DOB 09/22/1961, MRN [id number]  PCP:  No PCP Per Patient  Cardiologist:   Cassell Clement, MD   No chief complaint on file.     History of Present Illness: Sara Reeves is a 54 y.o. female who presents for evaluation of orthopnea and chest pressure.  She was asked to see [place] by the [facility] long emergency room she went on the morning of April 10 because of severe orthopnea and paroxysmal nocturnal dyspnea.  He does not have any prior history of known heart problems.  She has enjoyed good health.  He has had a previous problem with anxiety and has been on Ativan and trazodone in the past.  On the morning of 01/05/15 she awoke at about 6 AM severely short of breath.  She could not lie flat.  There was also a sense of pressure.  By the time she drove herself to the emergency room she was better and sitting in the parking lot of the emergency room she considered whether she should just go home or go into the ER to be checked.  She went into the ER to be checked.  Her chest x-ray showed normal heart size and raised question of possible bronchitis or asthma.  There was no sign of heart failure and her heart size was normal.  Her troponin was normal.  Her EKG in the emergency room showed inferolateral T-wave changes.  Since being seen in the emergency room she has had no further episodes. Does have risk factors for ischemic heart disease.  She smokes several cigarettes a day.  She has been told in the past that her cholesterol is high although she has never been on treatment for it.  She does not have any history of high blood pressure.  Her family history is negative for premature coronary disease.  Her father died of prostate cancer mother is living and well at age 78.    Past Medical History  Diagnosis Date  . Depressed     History reviewed. No pertinent past surgical history.   Current Outpatient Prescriptions  Medication Sig  Dispense Refill  . gabapentin (NEURONTIN) 300 MG capsule Take 300 mg by mouth 3 (three) times daily.    Marland Kitchen LORazepam (ATIVAN) 1 MG tablet Take 1 tablet (1 mg total) by mouth every 6 (six) hours as needed for anxiety or sleep. 13 tablet 0  . ranitidine (ZANTAC) 150 MG tablet Take 150 mg by mouth daily as needed for heartburn (indigestion).    . traZODone (DESYREL) 100 MG tablet Take 100 mg by mouth at bedtime.      No current facility-administered medications for this visit.    Allergies:   Review of patient's allergies indicates no known allergies.    Social History:  The patient  reports that she has been smoking.  She does not have any smokeless tobacco history on file. She reports that she drinks alcohol. She reports that she does not use illicit drugs.   Family History:  The patient's family history includes Prostate cancer in her father. no history of premature coronary disease   ROS:  Please see the history of present illness.   Otherwise, review of systems are positive for none.   All other systems are reviewed and negative.    PHYSICAL EXAM: VS:  BP 126/78 mmHg  Pulse 62  Ht  (1.575 m)  Wt 130 lb 1.9 oz (  59.022 kg)  BMI 23.79 kg/m2 , BMI Body mass index is 23.79 kg/(m^2). GEN: Well nourished, well developed, in no acute distress HEENT: normal Neck: no JVD, carotid bruits, or masses Cardiac: RRR; no murmurs, rubs, or gallops,no edema  Respiratory:  clear to auscultation bilaterally, normal work of breathing GI: soft, nontender, nondistended, + BS MS: no deformity or atrophy Skin: warm and dry, no rash Neuro:  Strength and sensation are intact Psych: euthymic mood, full affect   EKG:  EKG is ordered today. The ekg ordered today demonstrates T-wave inversions in the inferior and anterior leads which have improved since 01/05/15   Recent Labs: 01/05/2015: ALT 17; B Natriuretic Peptide 23.0; BUN 12; Creatinine 1.00; Hemoglobin 16.7*; Platelets 276; Potassium 3.7;  Sodium 141    Lipid Panel No results found for: CHOL, TRIG, HDL, CHOLHDL, VLDL, LDLCALC, LDLDIRECT    Wt Readings from Last 3 Encounters:  01/16/15 130 lb 1.9 oz (59.022 kg)  01/05/15 130 lb (58.968 kg)  04/27/14 130 lb (58.968 kg)      Other studies Reviewed: Additional studies/ records that were reviewed today include: Records from [facility] emergency room visit. Review of the above records demonstrates: See above history and physical   ASSESSMENT AND PLAN:  1.  Orthopnea and paroxysmal nocturnal dyspnea 2.  Abnormal EKG 3.  Hypercholesterolemia  Plan: We'll have her return for a treadmill Myoview stress test to rule out significant ischemic heart disease.  We will have her return for an echocardiogram to rule out significant valvular heart disease and rule out left atrial myxoma as a cause of her sudden orthopnea.  No new medications prescribed today.   Current medicines are reviewed at length with the patient today.  The patient does not have concerns regarding medicines.  The following changes have been made:  no change  Labs/ tests ordered today include:  Orders Placed This Encounter  Procedures  . Lipid panel  . Myocardial Perfusion Imaging  . EKG 12-Lead  . 2D Echocardiogram without contrast     Signed, Cassell Clementhomas Vibhav Waddill, MD  01/16/2015 3:59 PM    [facility] [address], [place], [place]  [address] Phone: [phone]; Fax: [phone]

## 2015-01-16 NOTE — Patient Instructions (Addendum)
Medication Instructions:  Your physician recommends that you continue on your current medications as directed. Please refer to the Current Medication list given to you today.  Labwork: When you return for your stress test, lipid panel-fasting  Testing/Procedures: Your physician has requested that you have an echocardiogram. Echocardiography is a painless test that uses sound waves to create images of your heart. It provides your doctor with information about the size and shape of your heart and how well your heart's chambers and valves are working. This procedure takes approximately one hour. There are no restrictions for this procedure.  Your physician has requested that you have en exercise stress myoview. For further information please visit [email or web address]. Please follow instruction sheet, as given.   Follow-Up: As needed

## 2015-01-29 ENCOUNTER — Telehealth (HOSPITAL_COMMUNITY): Payer: Self-pay

## 2015-01-29 NOTE — Telephone Encounter (Signed)
Patient given detailed instructions per Myocardial Perfusion Study Information Sheet for test on 01-30-2015 at 09:15am. Patient verbalized understanding. Randa EvensEdwards, Milicent Acheampong A

## 2015-01-30 ENCOUNTER — Other Ambulatory Visit (INDEPENDENT_AMBULATORY_CARE_PROVIDER_SITE_OTHER): Payer: No Typology Code available for payment source | Admitting: *Deleted

## 2015-01-30 ENCOUNTER — Ambulatory Visit (HOSPITAL_COMMUNITY): Payer: No Typology Code available for payment source | Attending: Internal Medicine

## 2015-01-30 DIAGNOSIS — R0609 Other forms of dyspnea: Secondary | ICD-10-CM | POA: Diagnosis not present

## 2015-01-30 DIAGNOSIS — R079 Chest pain, unspecified: Secondary | ICD-10-CM | POA: Diagnosis not present

## 2015-01-30 DIAGNOSIS — E7800 Pure hypercholesterolemia, unspecified: Secondary | ICD-10-CM

## 2015-01-30 DIAGNOSIS — R0789 Other chest pain: Secondary | ICD-10-CM | POA: Diagnosis not present

## 2015-01-30 DIAGNOSIS — R9431 Abnormal electrocardiogram [ECG] [EKG]: Secondary | ICD-10-CM | POA: Diagnosis present

## 2015-01-30 LAB — LIPID PANEL
Cholesterol: 272 mg/dL — ABNORMAL HIGH (ref 0–200)
HDL: 41.6 mg/dL (ref >39.00)
NonHDL: 230.4
Total CHOL/HDL Ratio: 7
Triglycerides: 225 mg/dL — ABNORMAL HIGH (ref 0.0–149.0)
VLDL: 45 mg/dL — ABNORMAL HIGH (ref 0.0–40.0)

## 2015-01-30 LAB — MYOCARDIAL PERFUSION IMAGING
CHL CUP NUCLEAR SSS: 9
CSEPED: 7 min
Estimated workload: 8.9 METS
Exercise duration (sec): 15 s
LV dias vol: 43 mL
LVSYSVOL: 11 mL
MPHR: 168 {beats}/min
NUC STRESS TID: 0.95
Nuc Stress EF: 74 %
Peak HR: 151 {beats}/min
Percent HR: 89 %
RATE: 0.31
Rest HR: 53 {beats}/min
SDS: 7
SRS: 2

## 2015-01-30 LAB — LDL CHOLESTEROL, DIRECT: Direct LDL: 184 mg/dL

## 2015-01-30 MED ORDER — TECHNETIUM TC 99M SESTAMIBI GENERIC - CARDIOLITE
33.0000 | Freq: Once | INTRAVENOUS | Status: AC | PRN
  Administered 2015-01-30: 33 via INTRAVENOUS

## 2015-01-30 MED ORDER — TECHNETIUM TC 99M SESTAMIBI GENERIC - CARDIOLITE
11.0000 | Freq: Once | INTRAVENOUS | Status: AC | PRN
  Administered 2015-01-30: 11 via INTRAVENOUS

## 2015-01-30 NOTE — Addendum Note (Signed)
Addended by: Tonita PhoenixBOWDEN, Cecillia Menees K on: 01/30/2015 09:13 AM   Modules accepted: Orders

## 2015-01-31 ENCOUNTER — Telehealth: Payer: Self-pay | Admitting: *Deleted

## 2015-01-31 DIAGNOSIS — E7800 Pure hypercholesterolemia, unspecified: Secondary | ICD-10-CM

## 2015-01-31 MED ORDER — ROSUVASTATIN CALCIUM 20 MG PO TABS: 20.0000 mg | ORAL_TABLET | Freq: Every day | ORAL | Status: DC

## 2015-01-31 NOTE — Telephone Encounter (Signed)
-----   Message from Cassell Clementhomas Brackbill, MD sent at 01/31/2015 10:29 AM EDT ----- Please report.  LDL cholesterol is very high.  Needs careful diet. Also start crestor generic 20 mg daily.  Recheck LP and HFP in 2 month

## 2015-01-31 NOTE — Telephone Encounter (Signed)
-----   Message from Cassell Clementhomas Brackbill, MD sent at 01/31/2015 10:25 AM EDT ----- Please report.  Stress test was normal.  No ischemia or scar.

## 2015-01-31 NOTE — Telephone Encounter (Signed)
Advised patient of lab results and medication added  And myoview

## 2015-02-04 ENCOUNTER — Telehealth: Payer: Self-pay | Admitting: *Deleted

## 2015-02-04 NOTE — Telephone Encounter (Signed)
Patient called and stated that her pharmacy informed her that the crestor needs a prior auth. Please advise. Thanks, MI

## 2015-02-05 NOTE — Telephone Encounter (Signed)
Spoke with patient and she has not tried and failed any alternative cholesterol lowering medications Will discuss with  Dr. Patty SermonsBrackbill tomorrow

## 2015-02-06 ENCOUNTER — Other Ambulatory Visit: Payer: Self-pay

## 2015-02-06 ENCOUNTER — Ambulatory Visit (HOSPITAL_COMMUNITY): Payer: No Typology Code available for payment source | Attending: Cardiology

## 2015-02-06 DIAGNOSIS — R9431 Abnormal electrocardiogram [ECG] [EKG]: Secondary | ICD-10-CM | POA: Insufficient documentation

## 2015-02-06 MED ORDER — ATORVASTATIN CALCIUM 40 MG PO TABS: 40.0000 mg | ORAL_TABLET | Freq: Every day | ORAL | Status: DC

## 2015-02-06 NOTE — Telephone Encounter (Signed)
Discussed with  Dr. Patty SermonsBrackbill and will have patient try Lipitor 40 mg daily Advised patient

## 2015-03-27 ENCOUNTER — Other Ambulatory Visit: Payer: No Typology Code available for payment source

## 2015-04-03 ENCOUNTER — Other Ambulatory Visit: Payer: No Typology Code available for payment source

## 2015-04-10 ENCOUNTER — Other Ambulatory Visit: Payer: No Typology Code available for payment source

## 2015-10-26 IMAGING — NM NM MYOCAR MULTI W/ SPECT
3 series · 18 of 18 positions shown · non-contrast
Comparison: none

[Series 1: rest_(id)_sa · 6.5mm · 6.51mm/px · 6 of 64 frames shown]
[frame 6/64]
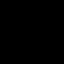
[frame 16/64]
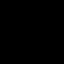
[frame 27/64]
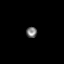
[frame 38/64]
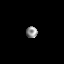
[frame 48/64]
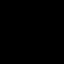
[frame 59/64]
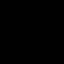

[Series 1: stress_(id)_sa · 6.5mm · 6.51mm/px · 6 of 64 frames shown (1 of 2)]
[frame 6/64]
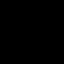
[frame 16/64]
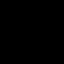
[frame 27/64]
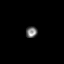
[frame 38/64]
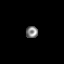
[frame 48/64]
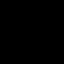
[frame 59/64]
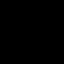

[Series 1: stress_(id)_sa · 6.5mm · 6.51mm/px · 6 of 512 frames shown (2 of 2)]
[frame 43/512]
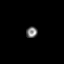
[frame 128/512]
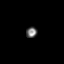
[frame 214/512]
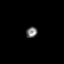
[frame 299/512]
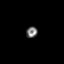
[frame 384/512]
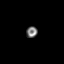
[frame 470/512]
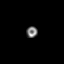

[18 of 18 positions shown; findings below may reference images not displayed]

Canned report from images found in remote index.

Refer to host system for actual result text.

## 2016-08-03 ENCOUNTER — Emergency Department (HOSPITAL_COMMUNITY)
Admission: EM | Admit: 2016-08-03 | Discharge: 2016-08-03 | Disposition: A | Discharge status: Home / Self Care | Payer: No Typology Code available for payment source | Attending: Emergency Medicine | Admitting: Emergency Medicine

## 2016-08-03 ENCOUNTER — Encounter (HOSPITAL_COMMUNITY): Payer: Self-pay | Admitting: Emergency Medicine

## 2016-08-03 DIAGNOSIS — S025XXA Fracture of tooth (traumatic), initial encounter for closed fracture: Secondary | ICD-10-CM

## 2016-08-03 DIAGNOSIS — K029 Dental caries, unspecified: Secondary | ICD-10-CM | POA: Insufficient documentation

## 2016-08-03 DIAGNOSIS — K047 Periapical abscess without sinus: Secondary | ICD-10-CM | POA: Insufficient documentation

## 2016-08-03 DIAGNOSIS — K0889 Other specified disorders of teeth and supporting structures: Secondary | ICD-10-CM

## 2016-08-03 DIAGNOSIS — F17200 Nicotine dependence, unspecified, uncomplicated: Secondary | ICD-10-CM | POA: Insufficient documentation

## 2016-08-03 MED ORDER — TRAMADOL HCL 50 MG PO TABS
50.0000 mg | ORAL_TABLET | Freq: Four times a day (QID) | ORAL | 0 refills | Status: AC | PRN
Start: 2016-08-03 — End: ?

## 2016-08-03 MED ORDER — CHLORHEXIDINE GLUCONATE 0.12 % MT SOLN: 15.0000 mL | Freq: Two times a day (BID) | OROMUCOSAL | 0 refills | Status: AC

## 2016-08-03 MED ORDER — PENICILLIN V POTASSIUM 500 MG PO TABS: 500.0000 mg | ORAL_TABLET | Freq: Three times a day (TID) | ORAL | 0 refills | Status: AC

## 2016-08-03 MED ORDER — PENICILLIN V POTASSIUM 500 MG PO TABS
500.0000 mg | ORAL_TABLET | Freq: Once | ORAL | Status: AC
  Administered 2016-08-03: 500 mg via ORAL
  Filled 2016-08-03: qty 1

## 2016-08-03 NOTE — ED Triage Notes (Signed)
Pt reports lower toothache. sts took ibuprofen last night with no relief. sts possible tooth abscess. No facial swelling noted.

## 2016-08-03 NOTE — ED Provider Notes (Signed)
[facility]-EMERGENCY DEPT Provider Note   CSN: [id number] Arrival date & time: 08/03/16  0827     History   Chief Complaint Chief Complaint  Patient presents with  . Dental Pain    HPI Sara Reeves is a 55 y.o. female.  HPI  Patient presents with concern of dental pain. Patient has history of dental issues for a long time, has dentures superiorly. She notes that she has multiple cavities on the lower teeth, has not yet had her teeth pulled. Over the past 24 hours she has developed increasing pain in the right incisor area, with swelling, erythema. No fever, chills, difficulty swallowing, speaking. No relief with ibuprofen.   Past Medical History:  Diagnosis Date  . Depressed     Patient Active Problem List   Diagnosis Date Noted  . Orthopnea 01/16/2015  . Paroxysmal nocturnal dyspnea 01/16/2015  . Abnormal EKG 01/16/2015  . Hypercholesterolemia 01/16/2015    History reviewed. No pertinent surgical history.  OB History    No data available       Home Medications    Prior to Admission medications   Medication Sig Start Date End Date Taking? Authorizing Provider  gabapentin (NEURONTIN) 300 MG capsule Take 300 mg by mouth 3 (three) times daily as needed (pain).    Yes Historical Provider, MD  traZODone (DESYREL) 150 MG tablet Take 75-150 mg by mouth at bedtime as needed for sleep.   Yes Historical Provider, MD  chlorhexidine (PERIDEX) 0.12 % solution Use as directed 15 mLs in the mouth or throat 2 (two) times daily. 08/03/16 08/08/16  Gerhard Munchobert Detavious Rinn, MD  penicillin v potassium (VEETID) 500 MG tablet Take 1 tablet (500 mg total) by mouth 3 (three) times daily. 08/03/16 08/10/16  Gerhard Munchobert Darrall Strey, MD  traMADol (ULTRAM) 50 MG tablet Take 1 tablet (50 mg total) by mouth every 6 (six) hours as needed. 08/03/16   Gerhard Munchobert Aaryn Sermon, MD    Family History Family History  Problem Relation Age of Onset  . Prostate cancer Father     Social History Social History  Substance Use  Topics  . Smoking status: Current Every Day Smoker  . Smokeless tobacco: Never Used  . Alcohol use Yes     Allergies   Patient has no known allergies.   Review of Systems Review of Systems  Constitutional: Negative for fever.  HENT: Positive for dental problem. Negative for congestion.   Respiratory: Negative for shortness of breath.   Cardiovascular: Negative for chest pain.  Musculoskeletal:       Negative aside from HPI  Skin:       Negative aside from HPI  Allergic/Immunologic: Negative for immunocompromised state.  Neurological: Negative for weakness.     Physical Exam Updated Vital Signs BP 113/80   Pulse 72   Temp 98.4 F (36.9 C) (Oral)   Resp 16   SpO2 95%   Physical Exam  Constitutional: She is oriented to person, place, and time. She appears well-developed and well-nourished. No distress.  HENT:  Head: Normocephalic and atraumatic.  Very poor dentition. Upper dentures Only 4 remaining teeth are central incisors on the lower surface. Both canine teeth fractured, and on the right, there is erythema, mild induration, no discharge, bleeding  Eyes: Conjunctivae and EOM are normal.  Cardiovascular: Normal rate and regular rhythm.   Pulmonary/Chest: Effort normal and breath sounds normal. No stridor. No respiratory distress.  Abdominal: She exhibits no distension.  Musculoskeletal: She exhibits no edema.  Neurological: She is alert and oriented  to person, place, and time. No cranial nerve deficit.  Skin: Skin is warm and dry.  Psychiatric: She has a normal mood and affect.  Nursing note and vitals reviewed.    ED Treatments / Results  Labs (all labs ordered are listed, but only abnormal results are displayed) Labs Reviewed - No data to display  EKG  EKG Interpretation None       Radiology No results found.  Procedures Procedures (including critical care time)  Medications Ordered in ED Medications  penicillin v potassium (VEETID) tablet  500 mg (not administered)     Initial Impression / Assessment and Plan / ED Course  I have reviewed the triage vital signs and the nursing notes.  Pertinent labs & imaging results that were available during my care of the patient were reviewed by me and considered in my medical decision making (see chart for details).  Clinical Course     Patient presents with dental pain, signs consistent with early infection. No evidence for deep space infection, oral swelling, concern for bacteremia, sepsis per Patient provided additional resources to ensure dental follow-up, started on antibiotics, analgesia, topical antibiotic rinse.  Final Clinical Impressions(s) / ED Diagnoses   Final diagnoses:  Pain, dental  Dental caries  Closed fracture of tooth, initial encounter  Dental infection    New Prescriptions New Prescriptions   CHLORHEXIDINE (PERIDEX) 0.12 % SOLUTION    Use as directed 15 mLs in the mouth or throat 2 (two) times daily.   PENICILLIN V POTASSIUM (VEETID) 500 MG TABLET    Take 1 tablet (500 mg total) by mouth 3 (three) times daily.   TRAMADOL (ULTRAM) 50 MG TABLET    Take 1 tablet (50 mg total) by mouth every 6 (six) hours as needed.     Gerhard Munchobert Geddy Boydstun, MD 08/03/16 [phone]
